# Patient Record
Sex: Male | Born: 1990 | Race: Black or African American | Hispanic: No | Marital: Single | Smoking: Current some day smoker
Health system: Southern US, Community
[De-identification: ages and names within clinical notes are randomized; demographics above are authoritative.]

## PROBLEM LIST (undated history)

## (undated) DIAGNOSIS — J45909 Unspecified asthma, uncomplicated: Secondary | ICD-10-CM

## (undated) HISTORY — PX: FRACTURE SURGERY: SHX138

---

## 2013-09-13 ENCOUNTER — Emergency Department (HOSPITAL_COMMUNITY)
Admission: EM | Admit: 2013-09-13 | Discharge: 2013-09-13 | Disposition: A | Discharge status: Home / Self Care | Payer: Self-pay | Attending: Emergency Medicine | Admitting: Emergency Medicine

## 2013-09-13 ENCOUNTER — Encounter (HOSPITAL_COMMUNITY): Payer: Self-pay | Admitting: Emergency Medicine

## 2013-09-13 DIAGNOSIS — F17200 Nicotine dependence, unspecified, uncomplicated: Secondary | ICD-10-CM | POA: Insufficient documentation

## 2013-09-13 DIAGNOSIS — J45909 Unspecified asthma, uncomplicated: Secondary | ICD-10-CM

## 2013-09-13 DIAGNOSIS — J45901 Unspecified asthma with (acute) exacerbation: Secondary | ICD-10-CM | POA: Insufficient documentation

## 2013-09-13 HISTORY — DX: Unspecified asthma, uncomplicated: J45.909

## 2013-09-13 MED ORDER — IPRATROPIUM-ALBUTEROL 0.5-2.5 (3) MG/3ML IN SOLN
3.0000 mL | Freq: Once | RESPIRATORY_TRACT | Status: AC
  Administered 2013-09-13: 3 mL via RESPIRATORY_TRACT

## 2013-09-13 MED ORDER — ALBUTEROL SULFATE HFA 108 (90 BASE) MCG/ACT IN AERS
2.0000 | INHALATION_SPRAY | RESPIRATORY_TRACT | Status: DC | PRN
  Administered 2013-09-13 (×2): 2 via RESPIRATORY_TRACT
  Filled 2013-09-13: qty 6.7

## 2013-09-13 MED ORDER — ALBUTEROL SULFATE HFA 108 (90 BASE) MCG/ACT IN AERS: 1.0000 | INHALATION_SPRAY | Freq: Four times a day (QID) | RESPIRATORY_TRACT | Status: DC | PRN

## 2013-09-13 MED ORDER — ALBUTEROL SULFATE (2.5 MG/3ML) 0.083% IN NEBU
INHALATION_SOLUTION | RESPIRATORY_TRACT | Status: AC
  Filled 2013-09-13: qty 3

## 2013-09-13 MED ORDER — ALBUTEROL SULFATE (2.5 MG/3ML) 0.083% IN NEBU
2.5000 mg | INHALATION_SOLUTION | Freq: Once | RESPIRATORY_TRACT | Status: AC
  Administered 2013-09-13: 2.5 mg via RESPIRATORY_TRACT

## 2013-09-13 MED ORDER — IPRATROPIUM-ALBUTEROL 0.5-2.5 (3) MG/3ML IN SOLN
RESPIRATORY_TRACT | Status: AC
  Administered 2013-09-13: 3 mL via RESPIRATORY_TRACT
  Filled 2013-09-13: qty 3

## 2013-09-13 NOTE — ED Notes (Signed)
Patient c/o asthma flare up started yesterday. Denies having rescue inhaler at home.

## 2013-09-13 NOTE — ED Notes (Signed)
RT requested to bedside for eval and tx

## 2013-09-13 NOTE — ED Provider Notes (Signed)
CSN: [id number]     Arrival date & time 09/13/13  0349 History   First MD Initiated Contact with Patient 09/13/13 [phone]     Chief Complaint  Patient presents with  . Asthma   HPI  History provided by the patient. Patient is a 23 year old male with history of asthma presenting with worsened asthma or wheezing symptoms. Patient believes that his asthma may have been trigger by lying flat tonight. He began having slight wheezing and tightness sensation but does not have any albuterol inhalers remaining at his home. He has otherwise been well recently. No URI symptoms. No fever, chills or sweats.   Past Medical History  Diagnosis Date  . Asthma    Past Surgical History  Procedure Laterality Date  . Fracture surgery      L arm   History reviewed. No pertinent family history. History  Substance Use Topics  . Smoking status: Current Some Day Smoker    Types: Cigarettes  . Smokeless tobacco: Not on file  . Alcohol Use: Yes     Comment: socially    Review of Systems  Constitutional: Negative for fever, chills and diaphoresis.  Respiratory: Positive for wheezing. Negative for cough and shortness of breath.   Cardiovascular: Negative for chest pain.  All other systems reviewed and are negative.     Allergies  Other  Home Medications   Prior to Admission medications   Not on File   BP 140/80  Pulse 66  Resp 22  Ht 6\' 2"  (1.88 m)  Wt 180 lb (81.647 kg)  BMI 23.10 kg/m2  SpO2 98% Physical Exam  Nursing note and vitals reviewed. Constitutional: He is oriented to person, place, and time. He appears well-developed and well-nourished.  HENT:  Head: Normocephalic.  Mouth/Throat: Oropharynx is clear and moist.  Cardiovascular: Normal rate and regular rhythm.   Pulmonary/Chest: Effort normal. No respiratory distress. He has wheezes. He has no rales.  Neurological: He is alert and oriented to person, place, and time.  Skin: Skin is warm.  Psychiatric: He has a normal mood and  affect. His behavior is normal.    ED Course  Procedures   COORDINATION OF CARE:  Nursing notes reviewed. Vital signs reviewed. Initial pt interview and examination performed.   Filed Vitals:   09/13/13 0357 09/13/13 0415  BP: 140/80   Pulse: 66   Resp: 22   Height: 6\' 2"  (1.88 m)   Weight: 180 lb (81.647 kg)   SpO2: 100% 98%    5:35 AM-patient seen and evaluated. Patient well appearing with normal respirations and O2 sats.  Patient received breathing treatment now without any wheezing and reports significant improvements. We'll send patient home with albuterol inhaler.  Treatment plan initiated: Medications  albuterol (PROVENTIL HFA;VENTOLIN HFA) 108 (90 BASE) MCG/ACT inhaler 2 puff (2 puffs Inhalation Given 09/13/13 0544)  ipratropium-albuterol (DUONEB) 0.5-2.5 (3) MG/3ML nebulizer solution 3 mL (3 mLs Nebulization Given 09/13/13 0414)  albuterol (PROVENTIL) (2.5 MG/3ML) 0.083% nebulizer solution 2.5 mg (2.5 mg Nebulization Given 09/13/13 0414)  albuterol (PROVENTIL) (2.5 MG/3ML) 0.083% nebulizer solution (  Duplicate 09/13/13 0414)      MDM   Final diagnoses:  Asthma        Angus SellerPeter S Dammen, PA-C 09/13/13 [phone]

## 2013-09-13 NOTE — Progress Notes (Signed)
Called to pt bedside by RN for pt assessment.  Per pt, he has hx of asthma and ran out of his rescue inhaler at home.  Bilateral wheezes noted throughout all lung fields.  HHN given with 5mg  albuterol and 0.50mg  atrovent per RT wheeze protocol.  Pt tolerated tx well.  Pt states his breathing is better.  Lung sounds now clear.  Dr Ranae PalmsYelverton notified.

## 2013-09-13 NOTE — Discharge Instructions (Signed)
Please follow up with a primary care provider for continued treatment of your asthma.   Asthma Asthma is a recurring condition in which the airways tighten and narrow. Asthma can make it difficult to breathe. It can cause coughing, wheezing, and shortness of breath. Asthma episodes, also called asthma attacks, range from minor to life-threatening. Asthma cannot be cured, but medicines and lifestyle changes can help control it. CAUSES Asthma is believed to be caused by inherited (genetic) and environmental factors, but its exact cause is unknown. Asthma may be triggered by allergens, lung infections, or irritants in the air. Asthma triggers are different for each person. Common triggers include:   Animal dander.  Dust mites.  Cockroaches.  Pollen from trees or grass.  Mold.  Smoke.  Air pollutants such as dust, household cleaners, hair sprays, aerosol sprays, paint fumes, strong chemicals, or strong odors.  Cold air, weather changes, and winds (which increase molds and pollens in the air).  Strong emotional expressions such as crying or laughing hard.  Stress.  Certain medicines (such as aspirin) or types of drugs (such as beta-blockers).  Sulfites in foods and drinks. Foods and drinks that may contain sulfites include dried fruit, potato chips, and sparkling grape juice.  Infections or inflammatory conditions such as the flu, a cold, or an inflammation of the nasal membranes (rhinitis).  Gastroesophageal reflux disease (GERD).  Exercise or strenuous activity. SYMPTOMS Symptoms may occur immediately after asthma is triggered or many hours later. Symptoms include:  Wheezing.  Excessive nighttime or early morning coughing.  Frequent or severe coughing with a common cold.  Chest tightness.  Shortness of breath. DIAGNOSIS  The diagnosis of asthma is made by a review of your medical history and a physical exam. Tests may also be performed. These may include:  Lung  function studies. These tests show how much air you breathe in and out.  Allergy tests.  Imaging tests such as X-rays. TREATMENT  Asthma cannot be cured, but it can usually be controlled. Treatment involves identifying and avoiding your asthma triggers. It also involves medicines. There are 2 classes of medicine used for asthma treatment:   Controller medicines. These prevent asthma symptoms from occurring. They are usually taken every day.  Reliever or rescue medicines. These quickly relieve asthma symptoms. They are used as needed and provide short-term relief. Your health care provider will help you create an asthma action plan. An asthma action plan is a written plan for managing and treating your asthma attacks. It includes a list of your asthma triggers and how they may be avoided. It also includes information on when medicines should be taken and when their dosage should be changed. An action plan may also involve the use of a device called a peak flow meter. A peak flow meter measures how well the lungs are working. It helps you monitor your condition. HOME CARE INSTRUCTIONS   Take medicine as directed by your health care provider. Speak with your health care provider if you have questions about how or when to take the medicines.  Use a peak flow meter as directed by your health care provider. Record and keep track of readings.  Understand and use the action plan to help minimize or stop an asthma attack without needing to seek medical care.  Control your home environment in the following ways to help prevent asthma attacks:  Do not smoke. Avoid being exposed to secondhand smoke.  Change your heating and air conditioning filter regularly.  Limit your  use of fireplaces and wood stoves.  Get rid of pests (such as roaches and mice) and their droppings.  Throw away plants if you see mold on them.  Clean your floors and dust regularly. Use unscented cleaning products.  Try to  have someone else vacuum for you regularly. Stay out of rooms while they are being vacuumed and for a short while afterward. If you vacuum, use a dust mask from a hardware store, a double-layered or microfilter vacuum cleaner bag, or a vacuum cleaner with a HEPA filter.  Replace carpet with wood, tile, or vinyl flooring. Carpet can trap dander and dust.  Use allergy-proof pillows, mattress covers, and box spring covers.  Wash bed sheets and blankets every week in hot water and dry them in a dryer.  Use blankets that are made of polyester or cotton.  Clean bathrooms and kitchens with bleach. If possible, have someone repaint the walls in these rooms with mold-resistant paint. Keep out of the rooms that are being cleaned and painted.  Wash hands frequently. SEEK MEDICAL CARE IF:   You have wheezing, shortness of breath, or a cough even if taking medicine to prevent attacks.  The colored mucus you cough up (sputum) is thicker than usual.  Your sputum changes from clear or white to yellow, green, gray, or bloody.  You have any problems that may be related to the medicines you are taking (such as a rash, itching, swelling, or trouble breathing).  You are using a reliever medicine more than 2-3 times per week.  Your peak flow is still at 50-79% of your personal best after following your action plan for 1 hour. SEEK IMMEDIATE MEDICAL CARE IF:   You seem to be getting worse and are unresponsive to treatment during an asthma attack.  You are short of breath even at rest.  You get short of breath when doing very little physical activity.  You have difficulty eating, drinking, or talking due to asthma symptoms.  You develop chest pain.  You develop a fast heartbeat.  You have a bluish color to your lips or fingernails.  You are lightheaded, dizzy, or faint.  Your peak flow is less than 50% of your personal best.  You have a fever or persistent symptoms for more than 2-3  days.  You have a fever and symptoms suddenly get worse. MAKE SURE YOU:   Understand these instructions.  Will watch your condition.  Will get help right away if you are not doing well or get worse. Document Released: 03/16/2005 Document Revised: 03/21/2013 Document Reviewed: 10/13/2012 [facility]ExitCare Patient Information 2015 [employer] [place]. This information is not intended to replace advice given to you by your health care provider. Make sure you discuss any questions you have with your health care provider.

## 2013-09-15 NOTE — ED Provider Notes (Signed)
Medical screening examination/treatment/procedure(s) were performed by non-physician practitioner and as supervising physician I was immediately available for consultation/collaboration.   EKG Interpretation None        David Yelverton, MD 09/15/13 2343 

## 2015-01-09 ENCOUNTER — Encounter (HOSPITAL_COMMUNITY): Payer: Self-pay | Admitting: *Deleted

## 2015-01-09 ENCOUNTER — Emergency Department (HOSPITAL_COMMUNITY)
Admission: EM | Admit: 2015-01-09 | Discharge: 2015-01-09 | Disposition: A | Discharge status: Home / Self Care | Payer: Self-pay | Attending: Emergency Medicine | Admitting: Emergency Medicine

## 2015-01-09 DIAGNOSIS — Z720 Tobacco use: Secondary | ICD-10-CM | POA: Insufficient documentation

## 2015-01-09 DIAGNOSIS — J45901 Unspecified asthma with (acute) exacerbation: Secondary | ICD-10-CM | POA: Insufficient documentation

## 2015-01-09 DIAGNOSIS — Z79899 Other long term (current) drug therapy: Secondary | ICD-10-CM | POA: Insufficient documentation

## 2015-01-09 MED ORDER — PREDNISONE 20 MG PO TABS
60.0000 mg | ORAL_TABLET | Freq: Once | ORAL | Status: AC
  Administered 2015-01-09: 60 mg via ORAL

## 2015-01-09 MED ORDER — PREDNISONE 20 MG PO TABS
60.0000 mg | ORAL_TABLET | Freq: Every day | ORAL | Status: DC
Start: 2015-01-09 — End: 2015-02-08

## 2015-01-09 MED ORDER — ALBUTEROL SULFATE HFA 108 (90 BASE) MCG/ACT IN AERS
INHALATION_SPRAY | RESPIRATORY_TRACT | Status: AC
  Filled 2015-01-09: qty 6.7

## 2015-01-09 MED ORDER — IPRATROPIUM BROMIDE 0.02 % IN SOLN
1.0000 mg | Freq: Once | RESPIRATORY_TRACT | Status: AC
  Administered 2015-01-09: 1 mg via RESPIRATORY_TRACT
  Filled 2015-01-09: qty 5

## 2015-01-09 MED ORDER — ALBUTEROL (5 MG/ML) CONTINUOUS INHALATION SOLN
10.0000 mg/h | INHALATION_SOLUTION | RESPIRATORY_TRACT | Status: DC
  Administered 2015-01-09: 10 mg/h via RESPIRATORY_TRACT
  Filled 2015-01-09: qty 20

## 2015-01-09 MED ORDER — PREDNISONE 20 MG PO TABS
ORAL_TABLET | ORAL | Status: AC
  Filled 2015-01-09: qty 3

## 2015-01-09 MED ORDER — ALBUTEROL SULFATE HFA 108 (90 BASE) MCG/ACT IN AERS
2.0000 | INHALATION_SPRAY | Freq: Once | RESPIRATORY_TRACT | Status: AC
  Administered 2015-01-09: 2 via RESPIRATORY_TRACT

## 2015-01-09 NOTE — ED Notes (Signed)
Pt states that he has been having trouble with his asthma all day.  Pt states that he does not have an inhaler and has been unable to rest due to feeling tight , pt reports mild cough and congestion pt states that he has trouble when the weather changes

## 2015-01-09 NOTE — ED Notes (Signed)
Pt states he feels a lot better, he feels jittery from albuterol he says.

## 2015-01-09 NOTE — ED Provider Notes (Signed)
CSN: [id number]     Arrival date & time 01/09/15  0153 History  By signing my name below, I, Scott Drake, attest that this documentation has been prepared under the direction and in the presence of Tomasita Crumble, MD. Electronically Signed: Budd Drake, ED Scribe. 01/09/2015. 3:37 AM.    Chief Complaint  Patient presents with  . Asthma   The history is provided by the patient. No language interpreter was used.   HPI Comments: Pravin Perezperez is a 24 y.o. male with a PMHx of asthma who presents to the Emergency Department complaining of an asthma flare up onset 2 days ago. He reports associated wheezing (onset 2 days ago), and productive cough (clear phlegm). He notes he has run out of his inhaler and has not been able to use it for this flare up. Pt denies fever.  Past Medical History  Diagnosis Date  . Asthma    Past Surgical History  Procedure Laterality Date  . Fracture surgery      L arm   History reviewed. No pertinent family history. Social History  Substance Use Topics  . Smoking status: Current Some Day Smoker    Types: Cigarettes  . Smokeless tobacco: None  . Alcohol Use: Yes     Comment: socially    Review of Systems A complete 10 system review of systems was obtained and all systems are negative except as noted in the HPI and PMH.   Allergies  Other  Home Medications   Prior to Admission medications   Medication Sig Start Date End Date Taking? Authorizing Provider  albuterol (PROVENTIL HFA;VENTOLIN HFA) 108 (90 BASE) MCG/ACT inhaler Inhale 1-2 puffs into the lungs every 6 (six) hours as needed for wheezing or shortness of breath. 09/13/13   Peter Dammen, PA-C   BP 136/74 mmHg  Pulse 71  Temp(Src) 97.6 F (36.4 C) (Oral)  Resp 14  SpO2 97% Physical Exam  Constitutional: He is oriented to person, place, and time. Vital signs are normal. He appears well-developed and well-nourished.  Non-toxic appearance. He does not appear ill. No distress.  HENT:   Head: Normocephalic and atraumatic.  Nose: Nose normal.  Mouth/Throat: Oropharynx is clear and moist. No oropharyngeal exudate.  Eyes: Conjunctivae and EOM are normal. Pupils are equal, round, and reactive to light. No scleral icterus.  Neck: Normal range of motion. Neck supple. No tracheal deviation, no edema, no erythema and normal range of motion present. No thyroid mass and no thyromegaly present.  Cardiovascular: Normal rate, regular rhythm, S1 normal, S2 normal, normal heart sounds, intact distal pulses and normal pulses.  Exam reveals no gallop and no friction rub.   No murmur heard. Pulses:      Radial pulses are 2+ on the right side, and 2+ on the left side.       Dorsalis pedis pulses are 2+ on the right side, and 2+ on the left side.  Pulmonary/Chest: Effort normal. No respiratory distress. He has wheezes. He has no rhonchi. He has no rales.  Inspiratory and expiratory wheezing bilaterally  Abdominal: Soft. Normal appearance and bowel sounds are normal. He exhibits no distension, no ascites and no mass. There is no hepatosplenomegaly. There is no tenderness. There is no rebound, no guarding and no CVA tenderness.  Musculoskeletal: Normal range of motion. He exhibits no edema or tenderness.  Lymphadenopathy:    He has no cervical adenopathy.  Neurological: He is alert and oriented to person, place, and time. He has normal strength. No  cranial nerve deficit or sensory deficit.  Skin: Skin is warm, dry and intact. No petechiae and no rash noted. He is not diaphoretic. No erythema. No pallor.  Psychiatric: He has a normal mood and affect. His behavior is normal. Judgment normal.  Nursing note and vitals reviewed.   ED Course  Procedures  DIAGNOSTIC STUDIES: Oxygen Saturation is 97% on RA, adequate by my interpretation.    COORDINATION OF CARE: 3:28 AM - Discussed plans to order a breathing treatment, inhaler refill, and steroids. Pt advised of plan for treatment and pt  agrees.  Labs Review Labs Reviewed - No data to display  Imaging Review No results found. I have personally reviewed and evaluated these images and lab results as part of my medical decision-making.   EKG Interpretation None      MDM   Final diagnoses:  None     patient presents to the emergency department for asthma exacerbation. Exam reveals wheezing. He does not have an inhaler. Patient was given continuous epidural treatment, placed on 5 day prednisone therapy. We'll provide albuterol inhaler to use at home as well as primary care follow-up. He appears well in no acute distress, vital signs remain within his normal limits and he is safe for discharge.    I, Elana Jian, personally performed the services described in this documentation. All medical record entries made by the scribe were at my direction and in my presence.  I have reviewed the chart and discharge instructions and agree that the record reflects my personal performance and is accurate and complete. Mohit Zirbes.  01/09/2015. 3:54 AM.     Tomasita CrumbleAdeleke Jaretzy Lhommedieu, MD 01/09/15 [id number]

## 2015-01-09 NOTE — Discharge Instructions (Signed)
Asthma, Adult Scott Drake, use your albuterol inhaler every 6 hours for 2 days.  Then use it only as needed.  Take steroids for 5 total days and see a primary care doctor within 3 days for close follow up.  If any symptoms worsen, come back to the ED immediately.  Thank you. Asthma is a condition of the lungs in which the airways tighten and narrow. Asthma can make it hard to breathe. Asthma cannot be cured, but medicine and lifestyle changes can help control it. Asthma may be started (triggered) by:  Animal skin flakes (dander).  Dust.  Cockroaches.  Pollen.  Mold.  Smoke.  Cleaning products.  Hair sprays or aerosol sprays.  Paint fumes or strong smells.  Cold air, weather changes, and winds.  Crying or laughing hard.  Stress.  Certain medicines or drugs.  Foods, such as dried fruit, potato chips, and sparkling grape juice.  Infections or conditions (colds, flu).  Exercise.  Certain medical conditions or diseases.  Exercise or tiring activities. HOME CARE   Take medicine as told by your doctor.  Use a peak flow meter as told by your doctor. A peak flow meter is a tool that measures how well the lungs are working.  Record and keep track of the peak flow meter's readings.  Understand and use the asthma action plan. An asthma action plan is a written plan for taking care of your asthma and treating your attacks.  To help prevent asthma attacks:  Do not smoke. Stay away from secondhand smoke.  Change your heating and air conditioning filter often.  Limit your use of fireplaces and wood stoves.  Get rid of pests (such as roaches and mice) and their droppings.  Throw away plants if you see mold on them.  Clean your floors. Dust regularly. Use cleaning products that do not smell.  Have someone vacuum when you are not home. Use a vacuum cleaner with a HEPA filter if possible.  Replace carpet with wood, tile, or vinyl flooring. Carpet can trap animal skin  flakes and dust.  Use allergy-proof pillows, mattress covers, and box spring covers.  Wash bed sheets and blankets every week in hot water and dry them in a dryer.  Use blankets that are made of polyester or cotton.  Clean bathrooms and kitchens with bleach. If possible, have someone repaint the walls in these rooms with mold-resistant paint. Keep out of the rooms that are being cleaned and painted.  Wash hands often. GET HELP IF:  You have make a whistling sound when breaking (wheeze), have shortness of breath, or have a cough even if taking medicine to prevent attacks.  The colored mucus you cough up (sputum) is thicker than usual.  The colored mucus you cough up changes from clear or white to yellow, green, gray, or bloody.  You have problems from the medicine you are taking such as:  A rash.  Itching.  Swelling.  Trouble breathing.  You need reliever medicines more than 2-3 times a week.  Your peak flow measurement is still at 50-79% of your personal best after following the action plan for 1 hour.  You have a fever. GET HELP RIGHT AWAY IF:   You seem to be worse and are not responding to medicine during an asthma attack.  You are short of breath even at rest.  You get short of breath when doing very little activity.  You have trouble eating, drinking, or talking.  You have chest pain.  You have a fast heartbeat.  Your lips or fingernails start to turn blue.  You are light-headed, dizzy, or faint.  Your peak flow is less than 50% of your personal best.   This information is not intended to replace advice given to you by your health care provider. Make sure you discuss any questions you have with your health care provider.   Document Released: 09/02/2007 Document Revised: 12/05/2014 Document Reviewed: 10/13/2012 Elsevier Interactive Patient Education 2016 [employer]  Asthma Attack Prevention While you may not be able to control the fact that you have  asthma, you can take actions to prevent asthma attacks. The best way to prevent asthma attacks is to maintain good control of your asthma. You can achieve this by:  Taking your medicines as directed.  Avoiding things that can irritate your airways or make your asthma symptoms worse (asthma triggers).  Keeping track of how well your asthma is controlled and of any changes in your symptoms.  Responding quickly to worsening asthma symptoms (asthma attack).  Seeking emergency care when it is needed. WHAT ARE SOME WAYS TO PREVENT AN ASTHMA ATTACK? Have a Plan Work with your health care provider to create a written plan for managing and treating your asthma attacks (asthma action plan). This plan includes:  A list of your asthma triggers and how you can avoid them.  Information on when medicines should be taken and when their dosages should be changed.  The use of a device that measures how well your lungs are working (peak flow meter). Monitor Your Asthma Use your peak flow meter and record your results in a journal every day. A drop in your peak flow numbers on one or more days may indicate the start of an asthma attack. This can happen even before you start to feel symptoms. You can prevent an asthma attack from getting worse by following the steps in your asthma action plan. Avoid Asthma Triggers Work with your asthma health care provider to find out what your asthma triggers are. This can be done by:  Allergy testing.  Keeping a journal that notes when asthma attacks occur and the factors that may have contributed to them.  Determining if there are other medical conditions that are making your asthma worse. Once you have determined your asthma triggers, take steps to avoid them. This may include avoiding excessive or prolonged exposure to:  Dust. Have someone dust and vacuum your home for you once or twice a week. Using a high-efficiency particulate arrestance (HEPA) vacuum is  best.  Smoke. This includes campfire smoke, forest fire smoke, and secondhand smoke from tobacco products.  Pet dander. Avoid contact with animals that you know you are allergic to.  Allergens from trees, grasses or pollens. Avoid spending a lot of time outdoors when pollen counts are high, and on very windy days.  Very cold, dry, or humid air.  Mold.  Foods that contain high amounts of sulfites.  Strong odors.  Outdoor air pollutants, such as Museum/gallery exhibitions officerengine exhaust.  Indoor air pollutants, such as aerosol sprays and fumes from household cleaners.  Household pests, including dust mites and cockroaches, and pest droppings.  Certain medicines, including NSAIDs. Always talk to your health care provider before stopping or starting any new medicines. Medicines Take over-the-counter and prescription medicines only as told by your health care provider. Many asthma attacks can be prevented by carefully following your medicine schedule. Taking your medicines correctly is especially important when you cannot avoid certain asthma  triggers. Act Quickly If an asthma attack does happen, acting quickly can decrease how severe it is and how long it lasts. Take these steps:   Pay attention to your symptoms. If you are coughing, wheezing, or having difficulty breathing, do not wait to see if your symptoms go away on their own. Follow your asthma action plan.  If you have followed your asthma action plan and your symptoms are not improving, call your health care provider or seek immediate medical care at the nearest hospital. It is important to note how often you need to use your fast-acting rescue inhaler. If you are using your rescue inhaler more often, it may mean that your asthma is not under control. Adjusting your asthma treatment plan may help you to prevent future asthma attacks and help you to gain better control of your condition. HOW CAN I PREVENT AN ASTHMA ATTACK WHEN I EXERCISE? Follow advice from  your health care provider about whether you should use your fast-acting inhaler before exercising. Many people with asthma experience exercise-induced bronchoconstriction (EIB). This condition often worsens during vigorous exercise in cold, humid, or dry environments. Usually, people with EIB can stay very active by pre-treating with a fast-acting inhaler before exercising.   This information is not intended to replace advice given to you by your health care provider. Make sure you discuss any questions you have with your health care provider.   Document Released: 03/04/2009 Document Revised: 12/05/2014 Document Reviewed: 08/16/2014 Elsevier Interactive Patient Education [employer]

## 2015-02-08 ENCOUNTER — Encounter (HOSPITAL_COMMUNITY): Payer: Self-pay | Admitting: Emergency Medicine

## 2015-02-08 ENCOUNTER — Emergency Department (HOSPITAL_COMMUNITY)
Admission: EM | Admit: 2015-02-08 | Discharge: 2015-02-08 | Disposition: A | Discharge status: Home / Self Care | Payer: Self-pay | Attending: Emergency Medicine | Admitting: Emergency Medicine

## 2015-02-08 DIAGNOSIS — J45901 Unspecified asthma with (acute) exacerbation: Secondary | ICD-10-CM | POA: Insufficient documentation

## 2015-02-08 DIAGNOSIS — Z720 Tobacco use: Secondary | ICD-10-CM | POA: Insufficient documentation

## 2015-02-08 MED ORDER — PREDNISONE 20 MG PO TABS
60.0000 mg | ORAL_TABLET | Freq: Once | ORAL | Status: AC
  Administered 2015-02-08: 60 mg via ORAL
  Filled 2015-02-08: qty 3

## 2015-02-08 MED ORDER — ALBUTEROL SULFATE HFA 108 (90 BASE) MCG/ACT IN AERS: 1.0000 | INHALATION_SPRAY | Freq: Four times a day (QID) | RESPIRATORY_TRACT | Status: DC | PRN

## 2015-02-08 MED ORDER — ALBUTEROL SULFATE (2.5 MG/3ML) 0.083% IN NEBU
5.0000 mg | INHALATION_SOLUTION | Freq: Once | RESPIRATORY_TRACT | Status: AC
  Administered 2015-02-08: 5 mg via RESPIRATORY_TRACT
  Filled 2015-02-08: qty 6

## 2015-02-08 MED ORDER — PREDNISONE 20 MG PO TABS: 40.0000 mg | ORAL_TABLET | Freq: Every day | ORAL | Status: DC

## 2015-02-08 MED ORDER — IPRATROPIUM BROMIDE 0.02 % IN SOLN
0.5000 mg | Freq: Once | RESPIRATORY_TRACT | Status: AC
  Administered 2015-02-08: 0.5 mg via RESPIRATORY_TRACT
  Filled 2015-02-08: qty 2.5

## 2015-02-08 MED ORDER — ALBUTEROL SULFATE HFA 108 (90 BASE) MCG/ACT IN AERS
2.0000 | INHALATION_SPRAY | Freq: Once | RESPIRATORY_TRACT | Status: DC
  Filled 2015-02-08: qty 6.7

## 2015-02-08 NOTE — ED Provider Notes (Signed)
CSN: [id number]     Arrival date & time 02/08/15  0419 History   First MD Initiated Contact with Patient 02/08/15 [phone]     Chief Complaint  Patient presents with  . Asthma     (Consider location/radiation/quality/duration/timing/severity/associated sxs/prior Treatment) HPI Comments: 24 year old male with a history of asthma presents to the emergency department for further evaluation of shortness of breath. Patient states that he began to experience shortness of breath with chest tightness a few hours ago. He reports that he ran out of his inhaler as he was only given a temporary inhaler following his last ED visit. He states that his symptoms today feel consistent with his past asthma exacerbations. He denies any nasal congestion, runny nose, cough, or fever. No vomiting or diarrhea. Patient reports a history of intubations and hospitalizations secondary to asthma, but only in childhood.  The history is provided by the patient. No language interpreter was used.    Past Medical History  Diagnosis Date  . Asthma    Past Surgical History  Procedure Laterality Date  . Fracture surgery      L arm   No family history on file. Social History  Substance Use Topics  . Smoking status: Current Some Day Smoker    Types: Cigarettes  . Smokeless tobacco: None  . Alcohol Use: Yes     Comment: socially    Review of Systems  Constitutional: Negative for fever.  Respiratory: Positive for chest tightness, shortness of breath and wheezing.   All other systems reviewed and are negative.   Allergies  Other  Home Medications   Prior to Admission medications   Medication Sig Start Date End Date Taking? Authorizing Provider  albuterol (PROVENTIL HFA;VENTOLIN HFA) 108 (90 BASE) MCG/ACT inhaler Inhale 1-2 puffs into the lungs every 6 (six) hours as needed for wheezing or shortness of breath. 02/08/15   Antony Madura, PA-C  predniSONE (DELTASONE) 20 MG tablet Take 2 tablets (40 mg total) by mouth  daily. 02/08/15   Antony Madura, PA-C   BP 123/74 mmHg  Pulse 80  Temp(Src) 98.2 F (36.8 C) (Oral)  Resp 18  SpO2 100%   Physical Exam Constitutional: He is oriented to person, place, and time. He appears well-developed and well-nourished. No distress.  Nontoxic/nonseptic appearing  HENT:  Head: Normocephalic and atraumatic.  Eyes: Conjunctivae and EOM are normal. No scleral icterus.  Neck: Normal range of motion.  Cardiovascular: Normal rate, regular rhythm and intact distal pulses.  Pulmonary/Chest: Effort normal. No respiratory distress. He has wheezes. He has no rales.  Mild expiratory wheezing diffusely. No tachypnea or dyspnea. No rales or rhonchi.  Musculoskeletal: Normal range of motion.  Neurological: He is alert and oriented to person, place, and time. He exhibits normal muscle tone. Coordination normal.  Skin: Skin is warm and dry. No rash noted. He is not diaphoretic. No erythema. No pallor.  Psychiatric: He has a normal mood and affect. His behavior is normal.  Nursing note and vitals reviewed.   ED Course  Procedures (including critical care time) Labs Review Labs Reviewed - No data to display  Imaging Review No results found.   I have personally reviewed and evaluated these images and lab results as part of my medical decision-making.   EKG Interpretation None      MDM   Final diagnoses:  Asthma exacerbation    24 year old male presents to the emergency department for complaints of shortness of breath with chest tightness and wheezing. Patient states that his  symptoms are consistent with his past asthma exacerbations. Wheezing has resolved after treatment with oral prednisone and DuoNeb. Patient states that he is feeling much better. He has no hypoxia. No fever or rales to suggest pneumonia.  Will ambulate patient in the emergency department to ensure no hypoxia with exertion. Anticipate discharge if SpO2 stable with ambulation. Will d/c with albuterol  inhaler and 5 day prednisone course. PCP f/u advised.   Filed Vitals:   02/08/15 0425 02/08/15 0445  BP: 123/74   Pulse: 96 80  Temp: 98.2 F (36.8 C)   TempSrc: Oral   Resp: 18   SpO2: 95% 100%     Antony MaduraKelly Koryn Charlot, PA-C 02/08/15 0556  Layla MawKristen N Ward, DO 02/08/15 [id number]

## 2015-02-08 NOTE — ED Notes (Signed)
Pt. Ambulated down the hall and back to his room on 100% room air and 92 heart rate without difficulty. Pt. Gait steady on his feet.

## 2015-02-08 NOTE — ED Notes (Signed)
Pt from home c/o shortness of breath. Hx of asthma. Pt reports he is out of his inhaler.

## 2015-02-08 NOTE — Discharge Instructions (Signed)

## 2015-03-13 ENCOUNTER — Emergency Department (HOSPITAL_COMMUNITY)
Admission: EM | Admit: 2015-03-13 | Discharge: 2015-03-13 | Disposition: A | Discharge status: Home / Self Care | Payer: Self-pay | Attending: Emergency Medicine | Admitting: Emergency Medicine

## 2015-03-13 ENCOUNTER — Encounter (HOSPITAL_COMMUNITY): Payer: Self-pay

## 2015-03-13 DIAGNOSIS — J45901 Unspecified asthma with (acute) exacerbation: Secondary | ICD-10-CM | POA: Insufficient documentation

## 2015-03-13 DIAGNOSIS — Z79899 Other long term (current) drug therapy: Secondary | ICD-10-CM | POA: Insufficient documentation

## 2015-03-13 DIAGNOSIS — F17210 Nicotine dependence, cigarettes, uncomplicated: Secondary | ICD-10-CM | POA: Insufficient documentation

## 2015-03-13 MED ORDER — PREDNISONE 20 MG PO TABS
60.0000 mg | ORAL_TABLET | Freq: Once | ORAL | Status: AC
  Administered 2015-03-13: 60 mg via ORAL
  Filled 2015-03-13: qty 3

## 2015-03-13 MED ORDER — IPRATROPIUM BROMIDE 0.02 % IN SOLN
0.5000 mg | Freq: Once | RESPIRATORY_TRACT | Status: DC
  Filled 2015-03-13: qty 2.5

## 2015-03-13 MED ORDER — ALBUTEROL SULFATE (2.5 MG/3ML) 0.083% IN NEBU
5.0000 mg | INHALATION_SOLUTION | Freq: Once | RESPIRATORY_TRACT | Status: DC
  Filled 2015-03-13: qty 6

## 2015-03-13 MED ORDER — ALBUTEROL SULFATE HFA 108 (90 BASE) MCG/ACT IN AERS
INHALATION_SPRAY | RESPIRATORY_TRACT | Status: AC
  Filled 2015-03-13: qty 6.7

## 2015-03-13 NOTE — ED Notes (Signed)
Pt complains of an asthma attack that started two hours ago

## 2015-03-13 NOTE — ED Provider Notes (Signed)
CSN: [id number]     Arrival date & time 03/13/15  0005 History   First MD Initiated Contact with Patient 03/13/15 0220     Chief Complaint  Patient presents with  . Asthma     (Consider location/radiation/quality/duration/timing/severity/associated sxs/prior Treatment) HPI Comments: Patient with a history of asthma presents to the emergency department for evaluation of shortness of breath. He states that this has been worsening over the day today. Symptoms associated with chest tightness and wheezing. He states that he is out of his inhaler which usually helps his symptoms. He denies any cough, fever, nausea, or vomiting. No recent URI symptoms. He states that symptoms today feel consistent with his past asthma exacerbations. He is not followed by a primary care doctor.  Patient is a 24 y.o. male presenting with asthma. The history is provided by the patient. No language interpreter was used.  Asthma Pertinent negatives include no chest pain, congestion or fever.    Past Medical History  Diagnosis Date  . Asthma    Past Surgical History  Procedure Laterality Date  . Fracture surgery      L arm   History reviewed. No pertinent family history. Social History  Substance Use Topics  . Smoking status: Current Some Day Smoker    Types: Cigarettes  . Smokeless tobacco: None  . Alcohol Use: Yes     Comment: socially    Review of Systems  Constitutional: Negative for fever.  HENT: Negative for congestion.   Respiratory: Positive for chest tightness, shortness of breath and wheezing.   Cardiovascular: Negative for chest pain.  All other systems reviewed and are negative.   Allergies  Other  Home Medications   Prior to Admission medications   Medication Sig Start Date End Date Taking? Authorizing Provider  albuterol (PROVENTIL HFA;VENTOLIN HFA) 108 (90 BASE) MCG/ACT inhaler Inhale 1-2 puffs into the lungs every 6 (six) hours as needed for wheezing or shortness of breath.  02/08/15  Yes Antony Madura, PA-C  predniSONE (DELTASONE) 20 MG tablet Take 2 tablets (40 mg total) by mouth daily. Patient not taking: Reported on 03/13/2015 02/08/15   Antony Madura, PA-C   BP 139/78 mmHg  Pulse 75  Temp(Src) 97.9 F (36.6 C) (Oral)  Resp 22  SpO2 100%   Physical Exam  Constitutional: He is oriented to person, place, and time. He appears well-developed and well-nourished. No distress.  Nontoxic/nonseptic appearing  HENT:  Head: Normocephalic and atraumatic.  Eyes: Conjunctivae and EOM are normal. No scleral icterus.  Neck: Normal range of motion.  Cardiovascular: Normal rate, regular rhythm and intact distal pulses.   Pulmonary/Chest: Effort normal and breath sounds normal. No respiratory distress. He has no wheezes. He has no rales.  Respirations even and unlabored. Lungs clear. No accessory muscle use.  Musculoskeletal: Normal range of motion.  Neurological: He is alert and oriented to person, place, and time. He exhibits normal muscle tone. Coordination normal.  Patient ambulates with steady gait  Skin: Skin is warm and dry. No rash noted. He is not diaphoretic. No erythema. No pallor.  Psychiatric: He has a normal mood and affect. His behavior is normal.  Nursing note and vitals reviewed.   ED Course  Procedures (including critical care time) Labs Review Labs Reviewed - No data to display  Imaging Review No results found.   I have personally reviewed and evaluated these images and lab results as part of my medical decision-making.   EKG Interpretation None      MDM  Final diagnoses:  Asthma exacerbation    Patient presents for symptoms consistent with asthma exacerbation x 1 day. Symptoms have resolved with oral steroids and DuoNeb. His lungs are now clear to auscultation. No accessory muscle use. Patient states that he is breathing at baseline. Will discharge with supportive treatment. Patient advised follow-up with his primary care doctor. Doubt  pneumonia given lack of fever or hypoxia. Return precautions given at discharge. Patient discharged in good condition with no unaddressed concerns.   Filed Vitals:   03/13/15 0011  BP: 139/78  Pulse: 75  Temp: 97.9 F (36.6 C)  TempSrc: Oral  Resp: 22  SpO2: 100%     Antony MaduraKelly Aastha Dayley, PA-C 03/13/15 [id number]  April Palumbo, MD 03/13/15 [phone]

## 2015-05-01 ENCOUNTER — Encounter (HOSPITAL_COMMUNITY): Payer: Self-pay

## 2015-05-01 ENCOUNTER — Emergency Department (HOSPITAL_COMMUNITY)
Admission: EM | Admit: 2015-05-01 | Discharge: 2015-05-01 | Disposition: A | Discharge status: Home / Self Care | Payer: Self-pay | Attending: Physician Assistant | Admitting: Physician Assistant

## 2015-05-01 ENCOUNTER — Emergency Department (HOSPITAL_COMMUNITY): Payer: Self-pay

## 2015-05-01 DIAGNOSIS — Z79899 Other long term (current) drug therapy: Secondary | ICD-10-CM | POA: Insufficient documentation

## 2015-05-01 DIAGNOSIS — J45901 Unspecified asthma with (acute) exacerbation: Secondary | ICD-10-CM | POA: Insufficient documentation

## 2015-05-01 DIAGNOSIS — F17210 Nicotine dependence, cigarettes, uncomplicated: Secondary | ICD-10-CM | POA: Insufficient documentation

## 2015-05-01 MED ORDER — ALBUTEROL SULFATE (2.5 MG/3ML) 0.083% IN NEBU
5.0000 mg | INHALATION_SOLUTION | Freq: Once | RESPIRATORY_TRACT | Status: AC
  Administered 2015-05-01: 5 mg via RESPIRATORY_TRACT
  Filled 2015-05-01: qty 6

## 2015-05-01 MED ORDER — ALBUTEROL SULFATE HFA 108 (90 BASE) MCG/ACT IN AERS: 1.0000 | INHALATION_SPRAY | Freq: Four times a day (QID) | RESPIRATORY_TRACT | Status: AC | PRN

## 2015-05-01 MED ORDER — ALBUTEROL SULFATE HFA 108 (90 BASE) MCG/ACT IN AERS
1.0000 | INHALATION_SPRAY | Freq: Once | RESPIRATORY_TRACT | Status: AC
  Administered 2015-05-01: 1 via RESPIRATORY_TRACT
  Filled 2015-05-01: qty 6.7

## 2015-05-01 MED ORDER — PREDNISONE 20 MG PO TABS: 40.0000 mg | ORAL_TABLET | Freq: Every day | ORAL | Status: AC

## 2015-05-01 NOTE — ED Notes (Signed)
Pt refused chest x-ray

## 2015-05-01 NOTE — Discharge Instructions (Signed)
Asthma Attack Prevention °While you may not be able to control the fact that you have asthma, you can take actions to prevent asthma attacks. The best way to prevent asthma attacks is to maintain good control of your asthma. You can achieve this by: °· Taking your medicines as directed. °· Avoiding things that can irritate your airways or make your asthma symptoms worse (asthma triggers). °· Keeping track of how well your asthma is controlled and of any changes in your symptoms. °· Responding quickly to worsening asthma symptoms (asthma attack). °· Seeking emergency care when it is needed. °WHAT ARE SOME WAYS TO PREVENT AN ASTHMA ATTACK? °Have a Plan °Work with your health care provider to create a written plan for managing and treating your asthma attacks (asthma action plan). This plan includes: °· A list of your asthma triggers and how you can avoid them. °· Information on when medicines should be taken and when their dosages should be changed. °· The use of a device that measures how well your lungs are working (peak flow meter). °Monitor Your Asthma °Use your peak flow meter and record your results in a journal every day. A drop in your peak flow numbers on one or more days may indicate the start of an asthma attack. This can happen even before you start to feel symptoms. You can prevent an asthma attack from getting worse by following the steps in your asthma action plan. °Avoid Asthma Triggers °Work with your asthma health care provider to find out what your asthma triggers are. This can be done by: °· Allergy testing. °· Keeping a journal that notes when asthma attacks occur and the factors that may have contributed to them. °· Determining if there are other medical conditions that are making your asthma worse. °Once you have determined your asthma triggers, take steps to avoid them. This may include avoiding excessive or prolonged exposure to: °· Dust. Have someone dust and vacuum your home for you once or  twice a week. Using a high-efficiency particulate arrestance (HEPA) vacuum is best. °· Smoke. This includes campfire smoke, forest fire smoke, and secondhand smoke from tobacco products. °· Pet dander. Avoid contact with animals that you know you are allergic to. °· Allergens from trees, grasses or pollens. Avoid spending a lot of time outdoors when pollen counts are high, and on very windy days. °· Very cold, dry, or humid air. °· Mold. °· Foods that contain high amounts of sulfites. °· Strong odors. °· Outdoor air pollutants, such as engine exhaust. °· Indoor air pollutants, such as aerosol sprays and fumes from household cleaners. °· Household pests, including dust mites and cockroaches, and pest droppings. °· Certain medicines, including NSAIDs. Always talk to your health care provider before stopping or starting any new medicines. °Medicines °Take over-the-counter and prescription medicines only as told by your health care provider. Many asthma attacks can be prevented by carefully following your medicine schedule. Taking your medicines correctly is especially important when you cannot avoid certain asthma triggers. °Act Quickly °If an asthma attack does happen, acting quickly can decrease how severe it is and how long it lasts. Take these steps:  °· Pay attention to your symptoms. If you are coughing, wheezing, or having difficulty breathing, do not wait to see if your symptoms go away on their own. Follow your asthma action plan. °· If you have followed your asthma action plan and your symptoms are not improving, call your health care provider or seek immediate medical care   at the nearest hospital. It is important to note how often you need to use your fast-acting rescue inhaler. If you are using your rescue inhaler more often, it may mean that your asthma is not under control. Adjusting your asthma treatment plan may help you to prevent future asthma attacks and help you to gain better control of your  condition. HOW CAN I PREVENT AN ASTHMA ATTACK WHEN I EXERCISE? Follow advice from your health care provider about whether you should use your fast-acting inhaler before exercising. Many people with asthma experience exercise-induced bronchoconstriction (EIB). This condition often worsens during vigorous exercise in cold, humid, or dry environments. Usually, people with EIB can stay very active by pre-treating with a fast-acting inhaler before exercising.   This information is not intended to replace advice given to you by your health care provider. Make sure you discuss any questions you have with your health care provider.   Document Released: 03/04/2009 Document Revised: 12/05/2014 Document Reviewed: 08/16/2014 Elsevier Interactive Patient Education 2016 [employer].  Asthma, Adult Asthma is a recurring condition in which the airways tighten and narrow. Asthma can make it difficult to breathe. It can cause coughing, wheezing, and shortness of breath. Asthma episodes, also called asthma attacks, range from minor to life-threatening. Asthma cannot be cured, but medicines and lifestyle changes can help control it. CAUSES Asthma is believed to be caused by inherited (genetic) and environmental factors, but its exact cause is unknown. Asthma may be triggered by allergens, lung infections, or irritants in the air. Asthma triggers are different for each person. Common triggers include:   Animal dander.  Dust mites.  Cockroaches.  Pollen from trees or grass.  Mold.  Smoke.  Air pollutants such as dust, household cleaners, hair sprays, aerosol sprays, paint fumes, strong chemicals, or strong odors.  Cold air, weather changes, and winds (which increase molds and pollens in the air).  Strong emotional expressions such as crying or laughing hard.  Stress.  Certain medicines (such as aspirin) or types of drugs (such as beta-blockers).  Sulfites in foods and drinks. Foods and drinks that  may contain sulfites include dried fruit, potato chips, and sparkling grape juice.  Infections or inflammatory conditions such as the flu, a cold, or an inflammation of the nasal membranes (rhinitis).  Gastroesophageal reflux disease (GERD).  Exercise or strenuous activity. SYMPTOMS Symptoms may occur immediately after asthma is triggered or many hours later. Symptoms include:  Wheezing.  Excessive nighttime or early morning coughing.  Frequent or severe coughing with a common cold.  Chest tightness.  Shortness of breath. DIAGNOSIS  The diagnosis of asthma is made by a review of your medical history and a physical exam. Tests may also be performed. These may include:  Lung function studies. These tests show how much air you breathe in and out.  Allergy tests.  Imaging tests such as X-rays. TREATMENT  Asthma cannot be cured, but it can usually be controlled. Treatment involves identifying and avoiding your asthma triggers. It also involves medicines. There are 2 classes of medicine used for asthma treatment:   Controller medicines. These prevent asthma symptoms from occurring. They are usually taken every day.  Reliever or rescue medicines. These quickly relieve asthma symptoms. They are used as needed and provide short-term relief. Your health care provider will help you create an asthma action plan. An asthma action plan is a written plan for managing and treating your asthma attacks. It includes a list of your asthma triggers and how they  may be avoided. It also includes information on when medicines should be taken and when their dosage should be changed. An action plan may also involve the use of a device called a peak flow meter. A peak flow meter measures how well the lungs are working. It helps you monitor your condition. HOME CARE INSTRUCTIONS   Take medicines only as directed by your health care provider. Speak with your health care provider if you have questions about  how or when to take the medicines.  Use a peak flow meter as directed by your health care provider. Record and keep track of readings.  Understand and use the action plan to help minimize or stop an asthma attack without needing to seek medical care.  Control your home environment in the following ways to help prevent asthma attacks:  Do not smoke. Avoid being exposed to secondhand smoke.  Change your heating and air conditioning filter regularly.  Limit your use of fireplaces and wood stoves.  Get rid of pests (such as roaches and mice) and their droppings.  Throw away plants if you see mold on them.  Clean your floors and dust regularly. Use unscented cleaning products.  Try to have someone else vacuum for you regularly. Stay out of rooms while they are being vacuumed and for a short while afterward. If you vacuum, use a dust mask from a hardware store, a double-layered or microfilter vacuum cleaner bag, or a vacuum cleaner with a HEPA filter.  Replace carpet with wood, tile, or vinyl flooring. Carpet can trap dander and dust.  Use allergy-proof pillows, mattress covers, and box spring covers.  Wash bed sheets and blankets every week in hot water and dry them in a dryer.  Use blankets that are made of polyester or cotton.  Clean bathrooms and kitchens with bleach. If possible, have someone repaint the walls in these rooms with mold-resistant paint. Keep out of the rooms that are being cleaned and painted.  Wash hands frequently. SEEK MEDICAL CARE IF:   You have wheezing, shortness of breath, or a cough even if taking medicine to prevent attacks.  The colored mucus you cough up (sputum) is thicker than usual.  Your sputum changes from clear or white to yellow, green, gray, or bloody.  You have any problems that may be related to the medicines you are taking (such as a rash, itching, swelling, or trouble breathing).  You are using a reliever medicine more than 2-3 times per  week.  Your peak flow is still at 50-79% of your personal best after following your action plan for 1 hour.  You have a fever. SEEK IMMEDIATE MEDICAL CARE IF:   You seem to be getting worse and are unresponsive to treatment during an asthma attack.  You are short of breath even at rest.  You get short of breath when doing very little physical activity.  You have difficulty eating, drinking, or talking due to asthma symptoms.  You develop chest pain.  You develop a fast heartbeat.  You have a bluish color to your lips or fingernails.  You are light-headed, dizzy, or faint.  Your peak flow is less than 50% of your personal best.   This information is not intended to replace advice given to you by your health care provider. Make sure you discuss any questions you have with your health care provider.   Document Released: 03/16/2005 Document Revised: 12/05/2014 Document Reviewed: 10/13/2012 Elsevier Interactive Patient Education [employer]   Emergency  Department Resource Guide 1) Find a Doctor and Pay Out of Pocket Although you won't have to find out who is covered by your insurance plan, it is a good idea to ask around and get recommendations. You will then need to call the office and see if the doctor you have chosen will accept you as a new patient and what types of options they offer for patients who are self-pay. Some doctors offer discounts or will set up payment plans for their patients who do not have insurance, but you will need to ask so you aren't surprised when you get to your appointment.  2) Contact Your Local Health Department Not all health departments have doctors that can see patients for sick visits, but many do, so it is worth a call to see if yours does. If you don't know where your local health department is, you can check in your phone book. The CDC also has a tool to help you locate your state's health department, and many state websites also have  listings of all of their local health departments.  3) Find a Walk-in Clinic If your illness is not likely to be very severe or complicated, you may want to try a walk in clinic. These are popping up all over the country in pharmacies, drugstores, and shopping centers. They're usually staffed by nurse practitioners or physician assistants that have been trained to treat common illnesses and complaints. They're usually fairly quick and inexpensive. However, if you have serious medical issues or chronic medical problems, these are probably not your best option.  No Primary Care Doctor: - Call Health Connect at  [phone] - they can help you locate a primary care doctor that  accepts your insurance, provides certain services, etc. - Physician Referral Service- [phone]  Chronic Pain Problems: Organization         Address  Phone   Notes  [facility]  [phone] Patients need to be referred by their primary care doctor.   Medication Assistance: Organization         Address  Phone   Notes  [facility] Medication [facility] [address] [place], [place] [address] [phone] --Must be a resident of [facility] -- Must have NO insurance coverage whatsoever (no Medicaid/ Medicare, etc.) -- The pt. MUST have a primary care doctor that directs their care regularly and follows them in the community   [facility]  [phone]   [employer]  [phone]    Agencies that provide inexpensive medical care: Organization         Address  Phone   Notes  [facility]  [phone]   [facility]    [phone]   [facility] [address] [place], [place] [address] [phone]   [facility] [address], [place] [phone]   Planned Parenthood    [phone]   [facility]    [phone]   Community Health and [facility]  [address], [place] Phone:  [phone], Fax:  [phone] Hours of Operation:  9 am - 6 pm, M-F.  Also accepts Medicaid/Medicare and self-pay.  [facility]  [address],  Phone: [phone], Fax: [phone]. Hours of Operation:  8:30 am - 5:30 pm, M-F.  Also accepts Medicaid and self-pay.  [facility] [place] [address], [place] Phone: [phone]   [facility] [address] Natasha Bence [place], [place] [phone], Ext. [phone] Mondays & Thursdays: 7-9 AM.  First 15 patients are seen on a first come, first serve basis.    Medicaid-accepting [facility] Providers:  Organization         Address  Phone   Notes  [facility] [address],  [phone] Also accepts self-pay patients.  [facility] [address] Laurell Josephs [place], [place]  [phone]   [facility] [address], [place] [phone]   [facility] [address], [place] [phone]   Renaye Rakers [address], [place]   [phone] Only accepts [place] Access [place] patients after they have their name applied to their card.   Self-Pay (no insurance) in [facility]:  Organization         Address  Phone   Notes  Sickle Cell Patients, [facility] [address] [place], [place] [phone]   [facility] [address] [place], [place] [phone]   [facility]  [address], [place] [phone]   [facility]/Dr. Osei-Bonsu  [address] [place] or [address], [place] [phone] Phone number for both [place] and [place] locations is the same.  [facility] [address], [place] [phone]   [facility] [address],  [place] or [address] [phone]   [facility] [address] [place] [phone], phone; [phone], fax Sees patients 1st and 3rd Saturday of every month.  Must not qualify for public or private insurance (i.e. Medicaid, Medicare, [place] Health Choice, Veterans' Benefits)  Household income should be no more than 200% of the poverty level The clinic cannot treat you if you are pregnant or think you are pregnant  Sexually transmitted diseases are not treated at the clinic.    Dental Care: Organization         Address  Phone  Notes  [facility] Department of [facility] ([facility]Rh) [address] [place], [place] [phone] Accepts children up to age 49 who are enrolled in [place] or [place] Health Choice; pregnant women with a Medicaid card; and children who have applied for Medicaid or [place] Health Choice, but were declined, whose parents can pay a reduced fee at time of service.  [facility] Department of [facility]  [address], [place] [phone] Accepts children up to age 44 who are enrolled in [place] or [place] Health Choice; pregnant women with a Medicaid card; and children who have applied for Medicaid or [place] Health Choice, but were declined, whose parents can pay a reduced fee at time of service.  [facility]  [address] [place], [place] [phone] Patients are seen by appointment only. Walk-ins are not accepted. [facility] will see patients 78 years of age and older. Monday - Tuesday (8am-5pm) Most Wednesdays (8:30-5pm) $30 per visit, cash only  [facility] Adult Dental Access PRO[facility]  [address], [facility] [phone] Patients are seen by appointment only. Walk-ins are not accepted. [facility] will see patients 59 years of age and older. One Wednesday  Evening (Monthly: Volunteer Based).  $30 per visit, cash only  [employer]  [phone] for adults; Children under age 6, call Graduate Pediatric Dentistry at [phone]. Children aged 9-14, please call [phone] to request a pediatric application.  Dental services are provided in all areas of dental care including fillings, crowns and bridges, complete and partial dentures, implants, gum treatment, root canals, and extractions. Preventive care is also provided. Treatment is provided to both adults and children. Patients are selected via a lottery and there is often a waiting list.   [facility] [address], [place]  [phone] [email or web address]   [facility] [address] [place], [place] [phone], Ext. [phone] Second and Fourth Thursday of each month, opens at 6:30 AM; Clinic ends at 9 AM.  Patients are seen on a first-come first-served basis, and a limited number are seen during each clinic.   [facility]  [address] Ether Griffins [place], [place] [phone]   Eligibility Requirements You must have lived in [place], [place], or [place] counties for at least the last three months.   You cannot be eligible for state or federal sponsored [place], including [employer], [place], or [employer].   You generally cannot be eligible for healthcare insurance through your employer.    How to apply: Eligibility screenings are held every Tuesday and Wednesday afternoon from 1:00 pm until 4:00 pm. You do not need an appointment for the interview!  [facility] [address] [place], [place] [phone]   [facility]  [phone]   [facility]  [phone]   [facility]  [phone]    Behavioral Health Resources in the Community: Intensive Outpatient Programs Organization         Address  Phone  Notes  [facility] Services [address] [place], [place] [phone]     [facility] Outpatient [address], [place], [place] [phone]   [facility] [address] [place], [place]  [phone]   [facility] [address],  [place], [place] [phone] or [phone]   Substance Abuse Resources Organization         Address  Phone  Notes  [facility]  [phone]   [facility]  [phone]   [facility]  [phone]   Floydene Flock  [phone]   Residential & Outpatient Substance Abuse Program  [phone]   Psychological Services Organization         Address  Phone  Notes  [facility]  [phone]   [facility]  [phone]   [facility] [address], [place] [phone] or [phone]    Mobile Crisis Teams Organization         Address  Phone  Notes  [facility], Mobile Crisis Care Unit  [phone]   [facility]  [address] [place], [place] [phone]   Doristine Locks [address] [place] [phone]    Self-Help/Support Groups Organization         Address  Phone             Notes  [facility] [place] - variety of support groups  336- I7437963 Call for more information  Narcotics Anonymous (NA), [facility] [address], [employer] [place]  2 meetings at this location  Residential Treatment Programs Organization         Address  Phone  Notes  [facility] [address],    [place]  [phone]   [facility]  [address] [place] [address], [place], [place] [phone]   [facility] [address] [place], [place] [phone] Admissions: 8am-3pm M-F  [facility] [address]    [place], [place] [phone]   [facility] [address] [place], [place], [place] [phone]   [facility] [address]  [place],  [place] [phone]   [facility] - Intensive Outpatient [address] Laurell Josephs [address], [place], [place] [phone]   [facility] ([facility] [address] [place]  [place], [place] [phone] or 9806423602   [facility] ([facility]) [address], [place], [place] [phone] Accepts Medicaid  [facility] [address]  [place] [phone] Substance Abuse/Addiction Treatment   [facility] Organization         Address  Phone  Notes  [facility]  [phone]   Angie Fava, PhD [address] Ervin [place], [place]   [phone] or [phone]   [facility] Behavioral   [address] [place], [place] [phone]   [facility] [address], [place], [place] [phone] Insurance/Medicaid/sponsorship through [facility] and Families [address]                                    [place], [place] [phone] Therapy/tele-psych/case  [facility] [address] [place], [place] [phone]    Dr. Lolly Mustache  [phone]   [facility]  [employer] [facility] Dept. 1) [address], [place] 2) [address], [place] 3)  [address], [place] [phone]  [phone]   [facility] Child Abuse Hotline 531-842-1762 or [phone] (After Hours)

## 2015-05-01 NOTE — ED Notes (Signed)
Bed: WA19 Expected date:  Expected time:  Means of arrival:  Comments: 

## 2015-05-01 NOTE — ED Notes (Signed)
Provider at bedside

## 2015-05-01 NOTE — ED Notes (Signed)
Pt complains of being short of breath and a tightness in his chest since last night

## 2015-05-01 NOTE — ED Provider Notes (Signed)
CSN: [id number]     Arrival date & time 05/01/15  4098 History   First MD Initiated Contact with Patient 05/01/15 [phone]     Chief Complaint  Patient presents with  . Shortness of Breath     (Consider location/radiation/quality/duration/timing/severity/associated sxs/prior Treatment) HPI   Scott Drake is a 25 y.o. male with PMH significant for asthma who presents with constant, moderate, worsening shortness of breath, chest tightness, and wheezing that began approximately 10 PM yesterday evening and became worse upon awakening this morning about 6 AM.  He reports he recently ran out of his inhaler.  He does not have a PCP.  Denies fever, cough, sore throat, rhinorrhea, nasal congestion, ear pain, CP, N/V, unilateral leg swelling, or abdominal pain.  He states that this is consistent with his asthma exacerbation.  Per chart review, he has been seen multiple times in ED with the last being 03/13/15 for asthma exacerbation.  Past Medical History  Diagnosis Date  . Asthma    Past Surgical History  Procedure Laterality Date  . Fracture surgery      L arm   History reviewed. No pertinent family history. Social History  Substance Use Topics  . Smoking status: Current Some Day Smoker    Types: Cigarettes  . Smokeless tobacco: None  . Alcohol Use: Yes     Comment: socially    Review of Systems  All other systems negative unless otherwise stated in HPI   Allergies  Other  Home Medications   Prior to Admission medications   Medication Sig Start Date End Date Taking? Authorizing Provider  albuterol (PROVENTIL HFA;VENTOLIN HFA) 108 (90 BASE) MCG/ACT inhaler Inhale 1-2 puffs into the lungs every 6 (six) hours as needed for wheezing or shortness of breath. 02/08/15   Antony Madura, PA-C  predniSONE (DELTASONE) 20 MG tablet Take 2 tablets (40 mg total) by mouth daily. Patient not taking: Reported on 03/13/2015 02/08/15   Antony Madura, PA-C   BP 144/97 mmHg  Pulse 98  Temp(Src) 97.6  F (36.4 C) (Oral)  Resp 20  Ht  (1.88 m)  Wt 90.719 kg  BMI 25.67 kg/m2  SpO2 100% Physical Exam  Constitutional: He is oriented to person, place, and time. He appears well-developed and well-nourished.  Non-toxic appearance. He does not have a sickly appearance. He does not appear ill.  HENT:  Head: Normocephalic and atraumatic.  Mouth/Throat: Oropharynx is clear and moist.  Eyes: Conjunctivae are normal. Pupils are equal, round, and reactive to light.  Neck: Normal range of motion. Neck supple.  Cardiovascular: Normal rate, regular rhythm and normal heart sounds.   No murmur heard. Pulmonary/Chest: Effort normal and breath sounds normal. No accessory muscle usage or stridor. No respiratory distress. He has no wheezes. He has no rhonchi. He has no rales.  No audible wheezing.  Patient able to speak in clear and full sentences without difficulty.   Abdominal: Soft. Bowel sounds are normal. He exhibits no distension. There is no tenderness.  Musculoskeletal: Normal range of motion.  Lymphadenopathy:    He has no cervical adenopathy.  Neurological: He is alert and oriented to person, place, and time.  Speech clear without dysarthria.  Skin: Skin is warm and dry.  Psychiatric: He has a normal mood and affect. His behavior is normal.    ED Course  Procedures (including critical care time) Labs Review Labs Reviewed - No data to display  Imaging Review No results found. I have personally reviewed and evaluated these images  and lab results as part of my medical decision-making.   EKG Interpretation None      MDM   Final diagnoses:  Asthma exacerbation    Patient presents with findings consistent with asthma exacerbation.  Patient's symptoms improved after receiving Duoneb at triage.  He reports he is no longer wheezing, and feels much better.  No fever or cough.  Doubt PNA.  Doubt PE. VSS, NAD.  No signs of respiratory distress.  Patient able to speak in clear and  complete sentences without difficulty.  Pulse ox while ambulating 100%.  Patient given albuterol inhaler while in ED.  Plan to discharge home with prednisone and albuterol.  Discussed return precautions.  Patient agrees and acknowledges the above plan for discharge.  Case has been seen by and discussed with Dr. Corlis Leak who agrees with the above plan for discharge.        Cheri Fowler, PA-C 05/01/15 1324  Courteney Randall An, MD 05/01/15 [phone]

## 2015-05-01 NOTE — ED Notes (Signed)
Bed: ON62 Expected date:  Expected time:  Means of arrival:  Comments: 68F/ Anxiety

## 2015-05-15 ENCOUNTER — Emergency Department (HOSPITAL_COMMUNITY)
Admission: EM | Admit: 2015-05-15 | Discharge: 2015-05-15 | Disposition: A | Discharge status: Home / Self Care | Payer: MEDICAID | Attending: Emergency Medicine | Admitting: Emergency Medicine

## 2015-05-15 ENCOUNTER — Encounter (HOSPITAL_COMMUNITY): Payer: Self-pay

## 2015-05-15 DIAGNOSIS — J45909 Unspecified asthma, uncomplicated: Secondary | ICD-10-CM | POA: Insufficient documentation

## 2015-05-15 DIAGNOSIS — Z5321 Procedure and treatment not carried out due to patient leaving prior to being seen by health care provider: Secondary | ICD-10-CM | POA: Insufficient documentation

## 2015-05-15 DIAGNOSIS — F17210 Nicotine dependence, cigarettes, uncomplicated: Secondary | ICD-10-CM | POA: Insufficient documentation

## 2015-05-15 NOTE — ED Notes (Signed)
Patient not found in lobby - called multiple times.

## 2015-05-15 NOTE — ED Notes (Signed)
Patient states he noticed right facial swelling and right tongue swelling 2 days ago. Patient states he has taken Ibuprofen and Tylenol and had decreased swelling, but is worse now. Patient denies any problems swallowing or breathing.

## 2015-05-16 NOTE — ED Provider Notes (Signed)
Blood pressure 129/87, pulse 86, temperature 98.8 F (37.1 C), temperature source Oral, resp. rate 16, height 6' 2.5" (1.892 m), weight 90.719 kg, SpO2 97 %.  Scott Drake is a 25 y.o. male complaining of facial swelling. Patient left without being seen after triage. He did not participate in the care of this patient.  Wynetta Emery, PA-C 05/16/15 0002  Azalia Bilis, MD 05/16/15 [phone]

## 2015-06-19 ENCOUNTER — Emergency Department (HOSPITAL_COMMUNITY)
Admission: EM | Admit: 2015-06-19 | Discharge: 2015-06-19 | Disposition: A | Discharge status: Home / Self Care | Payer: Self-pay | Attending: Emergency Medicine | Admitting: Emergency Medicine

## 2015-06-19 ENCOUNTER — Encounter (HOSPITAL_COMMUNITY): Payer: Self-pay | Admitting: Emergency Medicine

## 2015-06-19 ENCOUNTER — Emergency Department (HOSPITAL_COMMUNITY): Payer: MEDICAID

## 2015-06-19 DIAGNOSIS — K029 Dental caries, unspecified: Secondary | ICD-10-CM | POA: Insufficient documentation

## 2015-06-19 DIAGNOSIS — K0381 Cracked tooth: Secondary | ICD-10-CM | POA: Insufficient documentation

## 2015-06-19 DIAGNOSIS — L03211 Cellulitis of face: Secondary | ICD-10-CM | POA: Insufficient documentation

## 2015-06-19 DIAGNOSIS — K047 Periapical abscess without sinus: Secondary | ICD-10-CM | POA: Insufficient documentation

## 2015-06-19 DIAGNOSIS — Z79899 Other long term (current) drug therapy: Secondary | ICD-10-CM | POA: Insufficient documentation

## 2015-06-19 DIAGNOSIS — F17210 Nicotine dependence, cigarettes, uncomplicated: Secondary | ICD-10-CM | POA: Insufficient documentation

## 2015-06-19 DIAGNOSIS — J45909 Unspecified asthma, uncomplicated: Secondary | ICD-10-CM | POA: Insufficient documentation

## 2015-06-19 LAB — CBC WITH DIFFERENTIAL/PLATELET
Basophils Absolute: 0 10*3/uL (ref 0.0–0.1)
Basophils Relative: 0 %
Eosinophils Absolute: 0.7 10*3/uL (ref 0.0–0.7)
Eosinophils Relative: 7 %
HEMATOCRIT: 43.7 % (ref 39.0–52.0)
HEMOGLOBIN: 14.1 g/dL (ref 13.0–17.0)
LYMPHS ABS: 2 10*3/uL (ref 0.7–4.0)
LYMPHS PCT: 20 %
MCH: 29.9 pg (ref 26.0–34.0)
MCHC: 32.3 g/dL (ref 30.0–36.0)
MCV: 92.6 fL (ref 78.0–100.0)
MONOS PCT: 11 %
Monocytes Absolute: 1 10*3/uL (ref 0.1–1.0)
NEUTROS ABS: 6 10*3/uL (ref 1.7–7.7)
NEUTROS PCT: 62 %
Platelets: 172 10*3/uL (ref 150–400)
RBC: 4.72 MIL/uL (ref 4.22–5.81)
RDW: 13.5 % (ref 11.5–15.5)
WBC: 9.7 10*3/uL (ref 4.0–10.5)

## 2015-06-19 LAB — BASIC METABOLIC PANEL
Anion gap: 8 (ref 5–15)
BUN: 10 mg/dL (ref 6–20)
CHLORIDE: 106 mmol/L (ref 101–111)
CO2: 25 mmol/L (ref 22–32)
CREATININE: 0.99 mg/dL (ref 0.61–1.24)
Calcium: 8.7 mg/dL — ABNORMAL LOW (ref 8.9–10.3)
GFR calc non Af Amer: >60 mL/min (ref >60)
Glucose, Bld: 89 mg/dL (ref 65–99)
POTASSIUM: 3.9 mmol/L (ref 3.5–5.1)
SODIUM: 139 mmol/L (ref 135–145)

## 2015-06-19 MED ORDER — CLINDAMYCIN HCL 150 MG PO CAPS: 450.0000 mg | ORAL_CAPSULE | Freq: Four times a day (QID) | ORAL | Status: AC

## 2015-06-19 MED ORDER — IBUPROFEN 200 MG PO TABS
600.0000 mg | ORAL_TABLET | Freq: Once | ORAL | Status: AC
  Administered 2015-06-19: 600 mg via ORAL
  Filled 2015-06-19: qty 3

## 2015-06-19 MED ORDER — CLINDAMYCIN HCL 300 MG PO CAPS
450.0000 mg | ORAL_CAPSULE | Freq: Once | ORAL | Status: AC
  Administered 2015-06-19: 450 mg via ORAL
  Filled 2015-06-19: qty 1

## 2015-06-19 MED ORDER — IOHEXOL 300 MG/ML  SOLN
75.0000 mL | Freq: Once | INTRAMUSCULAR | Status: AC | PRN
  Administered 2015-06-19: 75 mL via INTRAVENOUS

## 2015-06-19 MED ORDER — HYDROCODONE-ACETAMINOPHEN 5-325 MG PO TABS: 1.0000 | ORAL_TABLET | ORAL | Status: AC | PRN

## 2015-06-19 MED ORDER — HYDROCODONE-ACETAMINOPHEN 5-325 MG PO TABS
1.0000 | ORAL_TABLET | Freq: Once | ORAL | Status: AC
  Administered 2015-06-19: 1 via ORAL
  Filled 2015-06-19: qty 1

## 2015-06-19 MED ORDER — SODIUM CHLORIDE 0.9 % IV BOLUS (SEPSIS)
1000.0000 mL | Freq: Once | INTRAVENOUS | Status: AC
  Administered 2015-06-19: 1000 mL via INTRAVENOUS

## 2015-06-19 NOTE — ED Notes (Signed)
Per pt, states right lower dental pain-right facial swelling

## 2015-06-19 NOTE — ED Notes (Signed)
EDP at bedside  

## 2015-06-19 NOTE — ED Provider Notes (Signed)
CSN: [id number]     Arrival date & time 06/19/15  [id number] History   First MD Initiated Contact with Patient 06/19/15 [phone]     Chief Complaint  Patient presents with  . Dental Pain     (Consider location/radiation/quality/duration/timing/severity/associated sxs/prior Treatment) HPI  25 year old male presents with right facial swelling. Patient states that a couple weeks ago he noticed that one of his right mandibular teeth appear to be broken. It was not particularly tender or painful. Last night noticed maybe a little bit of swelling but when he woke up this morning there was severe swelling to his right jaw. Patient denies any troubles breathing or talking. No trouble swallowing. No neck pain, stiffness. No fevers. Pain is not bad if he sits straight up but increases if he leans his head to the right.  Past Medical History  Diagnosis Date  . Asthma    Past Surgical History  Procedure Laterality Date  . Fracture surgery      L arm   No family history on file. Social History  Substance Use Topics  . Smoking status: Current Some Day Smoker    Types: Cigarettes  . Smokeless tobacco: Never Used  . Alcohol Use: Yes     Comment: socially    Review of Systems  Constitutional: Negative for fever.  HENT: Positive for dental problem and facial swelling. Negative for trouble swallowing and voice change.   Respiratory: Negative for shortness of breath and stridor.   Gastrointestinal: Negative for vomiting.  All other systems reviewed and are negative.     Allergies  Other  Home Medications   Prior to Admission medications   Medication Sig Start Date End Date Taking? Authorizing Provider  acetaminophen (TYLENOL) 500 MG tablet Take 1,000 mg by mouth every 6 (six) hours as needed for moderate pain or headache.    Historical Provider, MD  albuterol (PROVENTIL HFA;VENTOLIN HFA) 108 (90 Base) MCG/ACT inhaler Inhale 1-2 puffs into the lungs every 6 (six) hours as needed for wheezing or  shortness of breath. 05/01/15   Cheri FowlerKayla Rose, PA-C  predniSONE (DELTASONE) 20 MG tablet Take 2 tablets (40 mg total) by mouth daily. Patient not taking: Reported on 05/15/2015 05/01/15   Cheri FowlerKayla Rose, PA-C   BP 139/99 mmHg  Pulse 66  Temp(Src) 97.5 F (36.4 C) (Oral)  Resp 16  Ht 6\' 2"  (1.88 m)  Wt 200 lb (90.719 kg)  BMI 25.67 kg/m2  SpO2 98% Physical Exam  Constitutional: He is oriented to person, place, and time. He appears well-developed and well-nourished.  HENT:  Head: Normocephalic and atraumatic.    Right Ear: External ear normal.  Left Ear: External ear normal.  Nose: Nose normal.  Mouth/Throat: No oropharyngeal exudate.    No tongue swelling or swelling in oropharynx  Eyes: Right eye exhibits no discharge. Left eye exhibits no discharge.  Neck: Normal range of motion. Neck supple.  Cardiovascular: Normal rate, regular rhythm, normal heart sounds and intact distal pulses.   Pulmonary/Chest: Effort normal and breath sounds normal.  Abdominal: Soft. There is no tenderness.  Musculoskeletal: He exhibits no edema.  Neurological: He is alert and oriented to person, place, and time.  Skin: Skin is warm and dry.  Nursing note and vitals reviewed.   ED Course  Procedures (including critical care time) Labs Review Labs Reviewed  BASIC METABOLIC PANEL - Abnormal; Notable for the following:    Calcium 8.7 (*)    All other components within normal limits  CBC WITH DIFFERENTIAL/PLATELET  Imaging Review Ct Soft Tissue Neck W Contrast  06/19/2015  CLINICAL DATA:  25 year old male with right side neck swelling and pain since last night. Initial encounter. EXAM: CT NECK WITH CONTRAST TECHNIQUE: Multidetector CT imaging of the neck was performed using the standard protocol following the bolus administration of intravenous contrast. CONTRAST:  75mL OMNIPAQUE IOHEXOL 300 MG/ML  SOLN COMPARISON:  None. FINDINGS: Pharynx and larynx: Negative; symmetric palatine tonsil and adenoid  hypertrophy. Negative parapharyngeal and retropharyngeal spaces. Salivary glands: Sublingual space remains within normal limits. Submandibular glands and parotid glands are within normal limits. Right buccal space and right perimandibular generalized soft tissue swelling and stranding with superimposed 2 cm area of soft tissue hyper enhancement (series 4, image 32) which is along the right mandible posterior alveolar process. Associated right mandible posterior bicuspid periapical lucency with lateral cortical breakthrough (series 2, image 56 and 57). That tooth is carious. Trace or developing subperiosteal abscess which does not appear drainable at this time. The surrounding soft tissue enhancement is phlegmon like. Thyroid: Negative. Lymph nodes: Reactive level 1A and right level 1B lymph nodes. Mildly increased right level IIa nodes. Otherwise symmetric and normal for age cervical lymph node stations. Vascular: Major vascular structures in the neck and at the skullbase are patent. Limited intracranial: Negative. Visualized orbits: Negative. Mastoids and visualized paranasal sinuses: Mild to moderate bilateral maxillary and ethmoid sinus mucosal thickening with bubbly opacity on the left and superimposed polypoid probable mucous retention cyst. Visible sphenoid and frontal sinuses are clear. Visible tympanic cavities and mastoids are clear. Skeleton: Right mandible dental disease with periapical lucency and cortical breakthrough described above. Otherwise no acute osseous abnormality. Upper chest: Mild scarring or apical paraseptal emphysema at the right lung apex. Otherwise clear lungs. Negative superior mediastinum. IMPRESSION: 1. Odontogenic right facial soft tissue infection related to carious posterior right mandible bicuspid. Small/developing subperiosteal abscess does not appear drainable at this time. Surrounding soft tissue phlegmon. 2. Reactive level 1 and right level 2 lymphadenopathy. 3. Mild to  moderate paranasal sinus inflammatory changes, maximal in the left maxillary sinus. Electronically Signed   By: Odessa Fleming M.D.   On: 06/19/2015 09:46   I have personally reviewed and evaluated these images and lab results as part of my medical decision-making.   EKG Interpretation None      MDM   Final diagnoses:  Facial cellulitis  Dental infection    Workup shows facial cellulitis/developing abscess. Patient is stable, no airway signs/symptoms. This appears early. Will start on clinda, give pain control, and recommend close f/u with dentist/oral surgeon. Discussed strict return precautions    Pricilla Loveless, MD 06/19/15 [phone]

## 2016-07-26 IMAGING — CT CT NECK W/ CM
4 of 5 series · 16 of 33 positions shown, 18 images · IV contrast (OMNIPAQUE 300)
Comparison: None.

CLINICAL DATA: 24-year-old male with right side neck swelling and
pain since last night. Initial encounter.

EXAM:
CT NECK WITH CONTRAST
TECHNIQUE: Multidetector CT imaging of the neck was performed using the
standard protocol following the bolus administration of intravenous
contrast.
CONTRAST:  75mL OMNIPAQUE IOHEXOL 300 MG/ML  SOLN

[Series 2: neck with st · axial · 0.44mm/px · z∈[-312,-156]mm · 4 of 130 slices shown]
[im 26/130  bone]
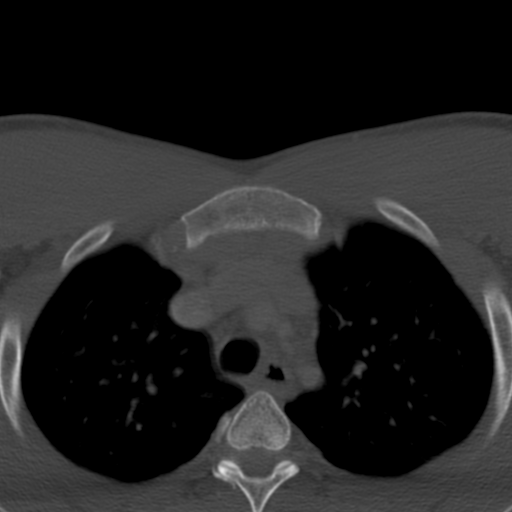
[im 52/130  bone]
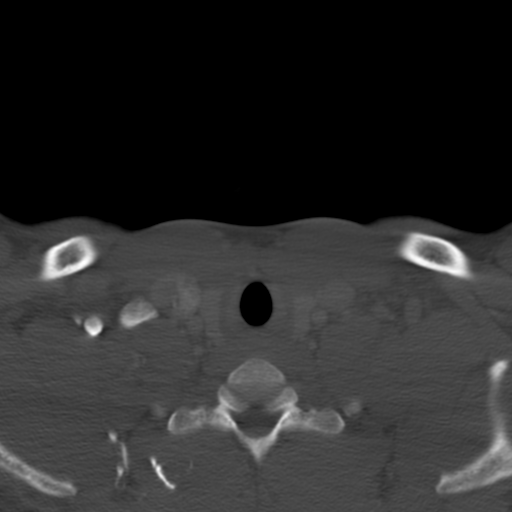
[im 78/130  bone]
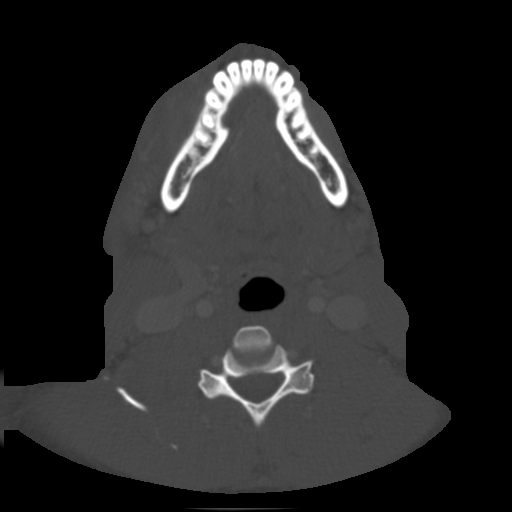
[im 104/130  bone]
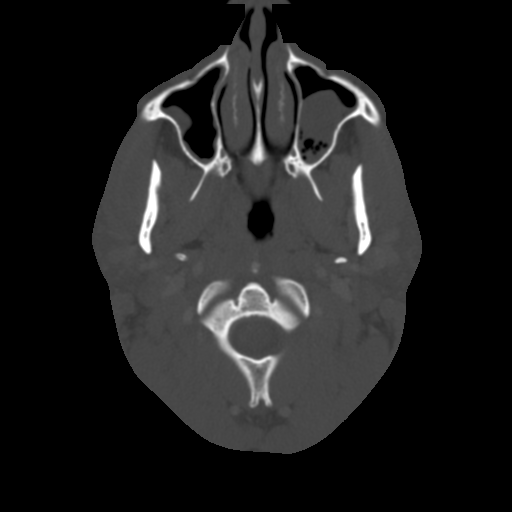

[Series 3: coronal st · coronal · 0.57mm/px · 3 of 105 slices shown]
[im 21/105  bone]
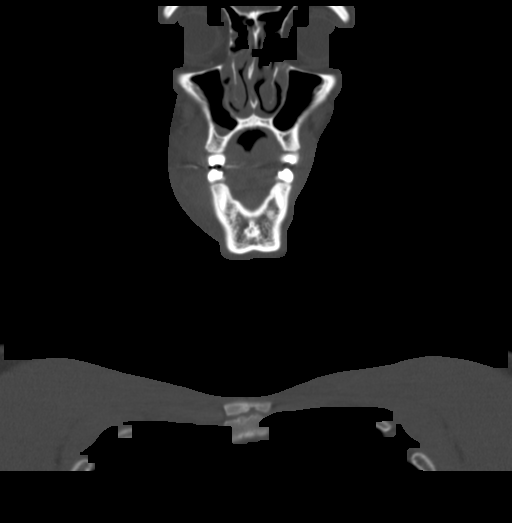
[im 42/105  bone]
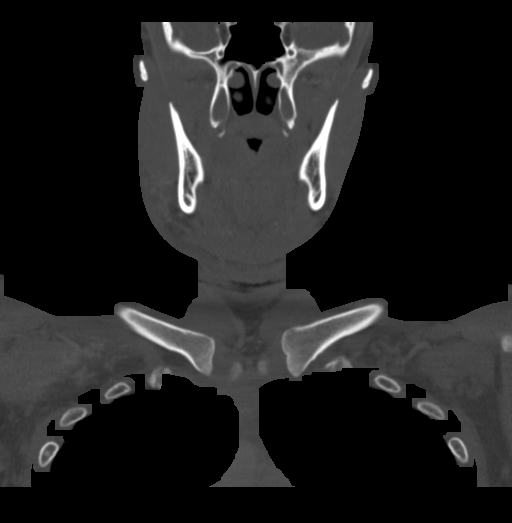
[im 63/105  bone]
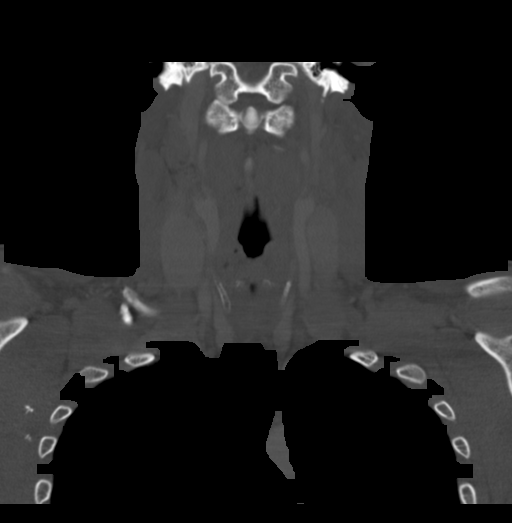

[Series 4: sagittal st · sagittal · 0.56mm/px · 5 of 92 slices shown, 6 images]
[im 31/92  bone]
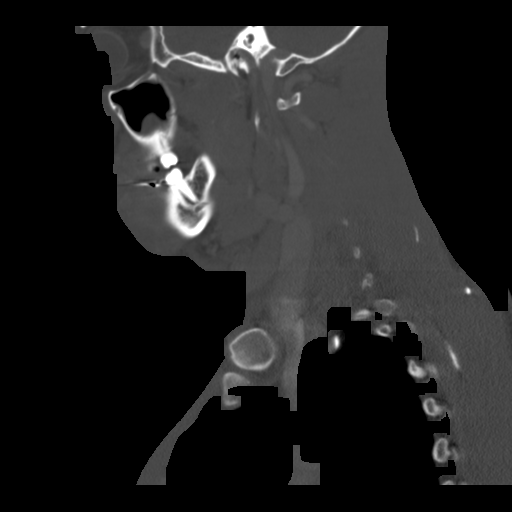
[im 38/92  bone]
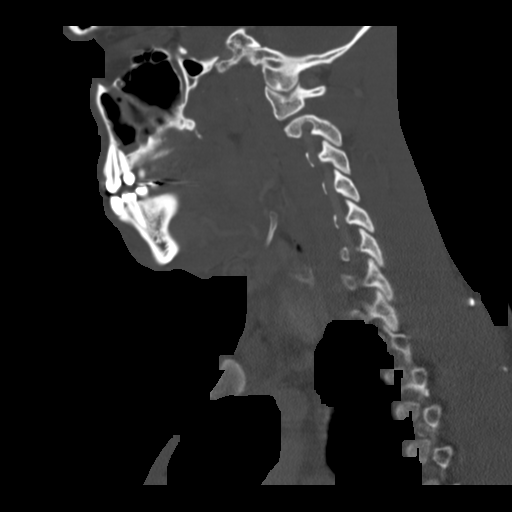
[im 46/92  soft-tissue]
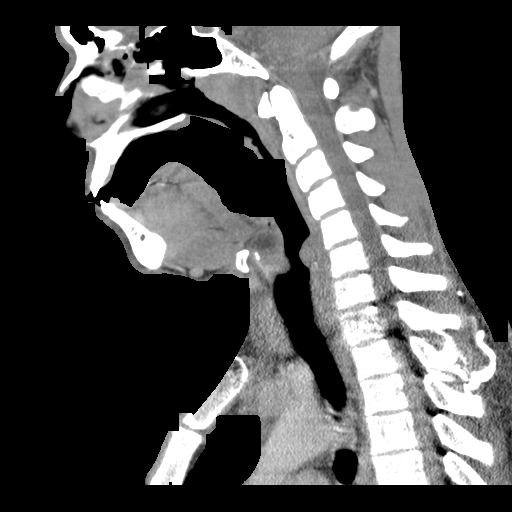
[im 46/92  bone]
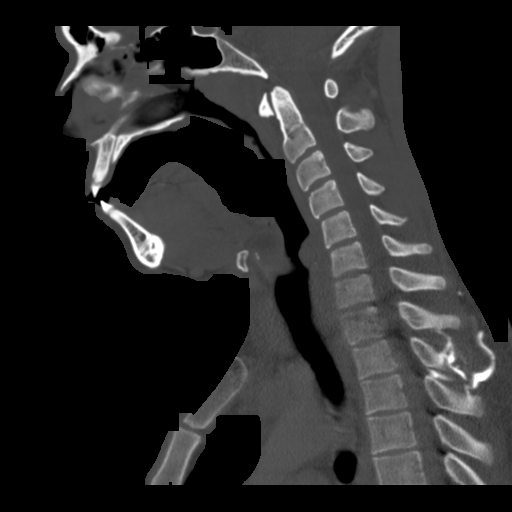
[im 54/92  bone]
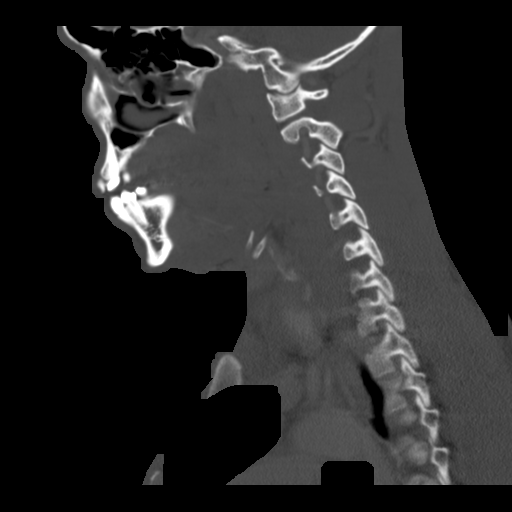
[im 61/92  bone]
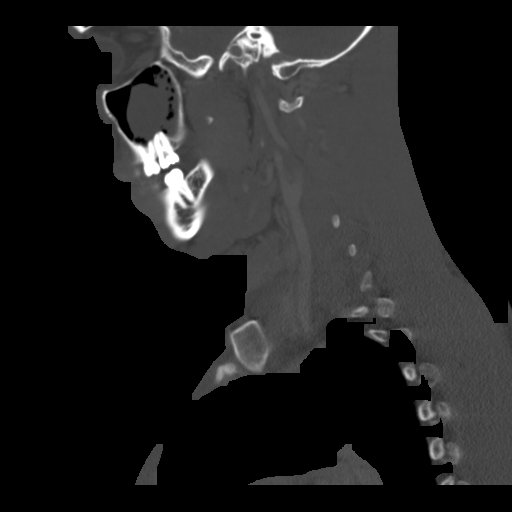

[Series 5: axial recons · axial · 0.46mm/px · z∈[-346,-181]mm · 4 of 144 slices shown, 5 images]
[im 29/144  soft-tissue]
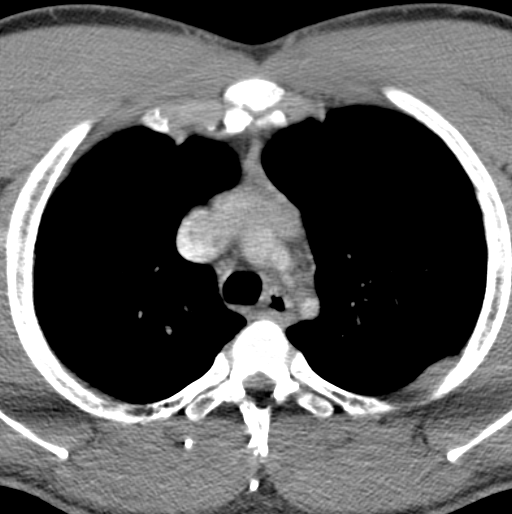
[im 29/144  bone]
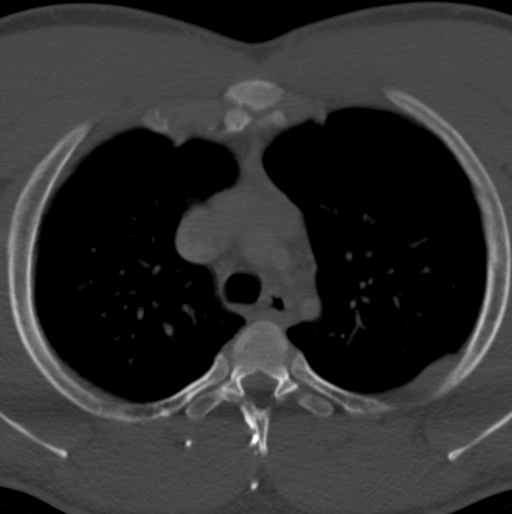
[im 58/144  bone]
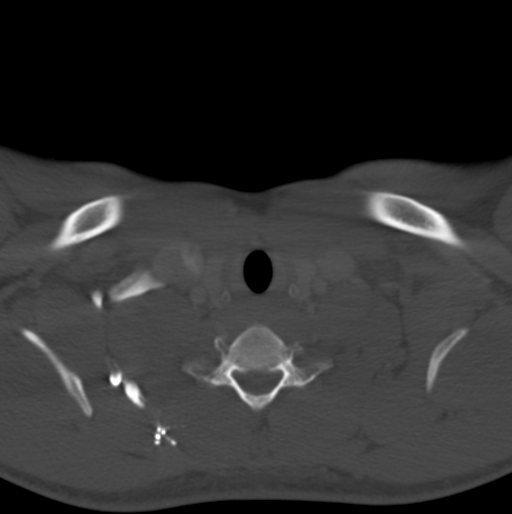
[im 86/144  bone]
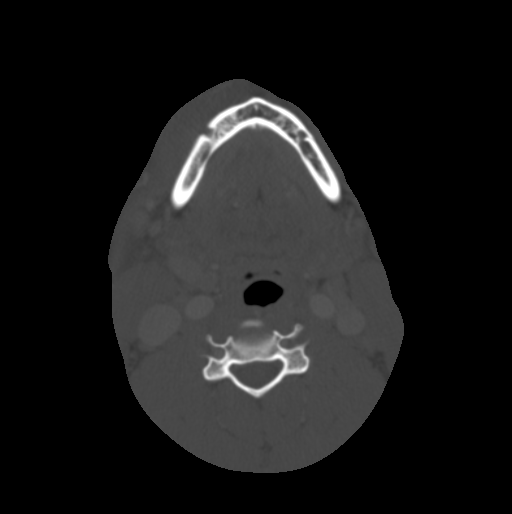
[im 115/144  bone]
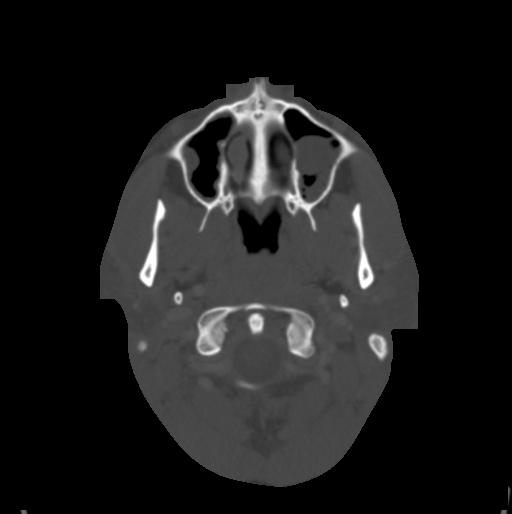

[16 of 33 positions shown; findings below may reference images not displayed]

FINDINGS: Pharynx and larynx: Negative; symmetric palatine tonsil and adenoid
hypertrophy. Negative parapharyngeal and retropharyngeal spaces.

Salivary glands: Sublingual space remains within normal limits.
Submandibular glands and parotid glands are within normal limits.

Right buccal space and right perimandibular generalized soft tissue
swelling and stranding with superimposed 2 cm area of soft tissue
hyper enhancement (series 4, image 32) which is along the right
mandible posterior alveolar process. Associated right mandible
posterior bicuspid periapical lucency with lateral cortical
breakthrough (series 2, image 56 and 57). That tooth is carious.
Trace or developing subperiosteal abscess which does not appear
drainable at this time. The surrounding soft tissue enhancement is
phlegmon like.

Thyroid: Negative.

Lymph nodes: Reactive level 1A and right level 1B lymph nodes.
Mildly increased right level IIa nodes. Otherwise symmetric and
normal for age cervical lymph node stations.

Vascular: Major vascular structures in the neck and at the skullbase
are patent.

Limited intracranial: Negative.

Visualized orbits: Negative.

Mastoids and visualized paranasal sinuses: Mild to moderate
bilateral maxillary and ethmoid sinus mucosal thickening with bubbly
opacity on the left and superimposed polypoid probable mucous
retention cyst. Visible sphenoid and frontal sinuses are clear.
Visible tympanic cavities and mastoids are clear.

Skeleton: Right mandible dental disease with periapical lucency and
cortical breakthrough described above.

Otherwise no acute osseous abnormality.

Upper chest: Mild scarring or apical paraseptal emphysema at the
right lung apex. Otherwise clear lungs. Negative superior
mediastinum.
IMPRESSION: 1. Odontogenic right facial soft tissue infection related to carious
posterior right mandible bicuspid. Small/developing subperiosteal
abscess does not appear drainable at this time. Surrounding soft
tissue phlegmon.
2. Reactive level 1 and right level 2 lymphadenopathy.
3. Mild to moderate paranasal sinus inflammatory changes, maximal in
the left maxillary sinus.
